# Patient Record
Sex: Female | Born: 1995 | Race: Black or African American | Hispanic: No | Marital: Single | Smoking: Never smoker
Health system: Southern US, Community
[De-identification: ages and names within clinical notes are randomized; demographics above are authoritative.]

## PROBLEM LIST (undated history)

## (undated) ENCOUNTER — Inpatient Hospital Stay (HOSPITAL_COMMUNITY): Payer: Self-pay

## (undated) DIAGNOSIS — G43909 Migraine, unspecified, not intractable, without status migrainosus: Secondary | ICD-10-CM

## (undated) DIAGNOSIS — T7840XA Allergy, unspecified, initial encounter: Secondary | ICD-10-CM

## (undated) HISTORY — DX: Allergy, unspecified, initial encounter: T78.40XA

## (undated) HISTORY — DX: Migraine, unspecified, not intractable, without status migrainosus: G43.909

---
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
## 2019-03-12 ENCOUNTER — Encounter: Payer: Self-pay | Admitting: Gastroenterology

## 2019-03-15 NOTE — Progress Notes (Signed)
referral paperwork entered.

## 2019-03-16 ENCOUNTER — Other Ambulatory Visit: Payer: Self-pay

## 2019-03-16 ENCOUNTER — Other Ambulatory Visit (INDEPENDENT_AMBULATORY_CARE_PROVIDER_SITE_OTHER): Payer: Self-pay

## 2019-03-16 ENCOUNTER — Encounter: Payer: Self-pay | Admitting: Gastroenterology

## 2019-03-16 ENCOUNTER — Ambulatory Visit: Payer: Self-pay | Admitting: Gastroenterology

## 2019-03-16 VITALS — BP 90/60 | HR 72 | Temp 98.7°F | Ht 64.0 in | Wt 107.0 lb

## 2019-03-16 DIAGNOSIS — K529 Noninfective gastroenteritis and colitis, unspecified: Secondary | ICD-10-CM

## 2019-03-16 DIAGNOSIS — R109 Unspecified abdominal pain: Secondary | ICD-10-CM

## 2019-03-16 DIAGNOSIS — R112 Nausea with vomiting, unspecified: Secondary | ICD-10-CM

## 2019-03-16 LAB — C-REACTIVE PROTEIN: CRP: <1 mg/dL (ref 0.5–20.0)

## 2019-03-16 LAB — COMPREHENSIVE METABOLIC PANEL
ALT: 21 U/L (ref 0–35)
AST: 19 U/L (ref 0–37)
Albumin: 4.8 g/dL (ref 3.5–5.2)
Alkaline Phosphatase: 43 U/L (ref 39–117)
BUN: 10 mg/dL (ref 6–23)
CO2: 25 mEq/L (ref 19–32)
Calcium: 9.7 mg/dL (ref 8.4–10.5)
Chloride: 105 mEq/L (ref 96–112)
Creatinine, Ser: 0.97 mg/dL (ref 0.40–1.20)
GFR: 85.5 mL/min (ref >60.00)
Glucose, Bld: 96 mg/dL (ref 70–99)
Potassium: 3.8 mEq/L (ref 3.5–5.1)
Sodium: 139 mEq/L (ref 135–145)
Total Bilirubin: 0.4 mg/dL (ref 0.2–1.2)
Total Protein: 7.7 g/dL (ref 6.0–8.3)

## 2019-03-16 LAB — CBC WITH DIFFERENTIAL/PLATELET
Basophils Absolute: 0.1 10*3/uL (ref 0.0–0.1)
Basophils Relative: 1 % (ref 0.0–3.0)
Eosinophils Absolute: 0.4 10*3/uL (ref 0.0–0.7)
Eosinophils Relative: 4.2 % (ref 0.0–5.0)
HCT: 40.7 % (ref 36.0–46.0)
Hemoglobin: 13.6 g/dL (ref 12.0–15.0)
Lymphocytes Relative: 37.8 % (ref 12.0–46.0)
Lymphs Abs: 3.3 10*3/uL (ref 0.7–4.0)
MCHC: 33.5 g/dL (ref 30.0–36.0)
MCV: 93.1 fl (ref 78.0–100.0)
Monocytes Absolute: 0.6 10*3/uL (ref 0.1–1.0)
Monocytes Relative: 6.9 % (ref 3.0–12.0)
Neutro Abs: 4.3 10*3/uL (ref 1.4–7.7)
Neutrophils Relative %: 50.1 % (ref 43.0–77.0)
Platelets: 289 10*3/uL (ref 150.0–400.0)
RBC: 4.37 Mil/uL (ref 3.87–5.11)
RDW: 13.4 % (ref 11.5–15.5)
WBC: 8.6 10*3/uL (ref 4.0–10.5)

## 2019-03-16 LAB — IGA: IgA: 109 mg/dL (ref 68–378)

## 2019-03-16 MED ORDER — ONDANSETRON 4 MG PO TBDP: 4.0000 mg | ORAL_TABLET | Freq: Four times a day (QID) | ORAL | 3 refills | Status: DC | PRN

## 2019-03-16 MED ORDER — SUPREP BOWEL PREP KIT 17.5-3.13-1.6 GM/177ML PO SOLN: ORAL | 0 refills | Status: DC

## 2019-03-16 MED ORDER — PANTOPRAZOLE SODIUM 40 MG PO TBEC: 40.0000 mg | DELAYED_RELEASE_TABLET | Freq: Every day | ORAL | 3 refills | Status: DC

## 2019-03-16 NOTE — Progress Notes (Signed)
HPI :  23 y/o female with a history of migraines, referred by Venida Jarvis MD for chronic diarrhea, multiple upper tract symptoms.   She reports in general symptoms bothering her since November.  She is frequently nauseated, and has a few episodes of vomiting per month which are unpredictable and occur without any clear precipitants.  Eating can sometimes precipitate her symptoms however fasting can also make her feel worse.  She generally has a very poor appetite and does not eat well.  She denies any heartburn or reflux symptoms, she denies any dysphagia.  She does have some discomfort in her epigastric area to mid abdomen.  She states this is intermittent and it comes and goes.  Again eating can often make this feel worse however at times can make her feel better.  She had had a trial of omeprazole which she states did not feel that it helps her too much, but then transition to ranitidine and found some help but it ran out.  During this time she has had persistent loose stools with urgency that have bothered her, she has roughly 1-2 bowel movements per day.  She does not see any obvious red blood however notes that her stools are intermittently dark black.  She does not find any relief of her abdomen discomfort with a bowel movement.  She thinks milk and dairy makes her symptoms worse she feels gassy and bloated frequently.  She previously took [facility] powder almost daily for migraine headaches back in November, she has stopped since then, but she does use ibuprofen periodically.  She is never had any prior surgeries.  She has no family history of inflammatory bowel disease, celiac disease, or GI tract malignancies that she is aware of.  She is never had an upper endoscopy or colonoscopy.  More recently her migraines have not been bothering her too much.  Generally she feels much worse in the morning than later in the day.  No fevers.  She does not use alcohol routinely.  She does endorse using marijuana  but roughly once or twice a month at most, only in social settings.  She denies any routine use of this.  She did not use marijuana to treat her symptoms.  She has tried some Pepto-Bismol which has not helped.  She has lost weight with the symptoms due to her poor appetite.  She is lost 7 pounds in the last month, currently weighs 107 pounds and is concerned about her weight.  In all her symptoms are a very dramatic change for her since November.  She never had any bowel symptoms or problems eating prior to this onset of symptoms and is quite concerned about it.  She states she has not had any blood work or imaging thus far since symptoms began.   Past Medical History:  Diagnosis Date  . Migraines      History reviewed. No pertinent surgical history. Family History  Problem Relation Age of Onset  . Depression Mother   . Diabetes Maternal Grandmother   . Hypertension Maternal Grandmother   . Heart disease Maternal Grandmother   . Cancer Maternal Aunt   . Cancer Paternal Aunt    Social History   Tobacco Use  . Smoking status: Never Smoker  . Smokeless tobacco: Never Used  Substance Use Topics  . Alcohol use: Not Currently  . Drug use: Never   Current Outpatient Medications  Medication Sig Dispense Refill  . fluconazole (DIFLUCAN) 150 MG tablet Take 150 mg  by mouth every 30 (thirty) days. Take one pill after each menstrual period for treatment of monthly yeast infection    . levonorgestrel-ethinyl estradiol (ALESSE) 0.1-20 MG-MCG tablet Take 1 tablet by mouth daily.    . ondansetron (ZOFRAN-ODT) 4 MG disintegrating tablet Take 1 tablet (4 mg total) by mouth every 6 (six) hours as needed for nausea or vomiting. 30 tablet 3  . pantoprazole (PROTONIX) 40 MG tablet Take 1 tablet (40 mg total) by mouth daily. 30 tablet 3  . SUPREP BOWEL PREP KIT 17.5-3.13-1.6 GM/177ML SOLN Suprep-Use as directed 354 mL 0   No current facility-administered medications for this visit.    No Known  Allergies   Review of Systems: All systems reviewed and negative except where noted in HPI.     Physical Exam: BP 90/60   Pulse 72   Temp 98.7 F (37.1 C) (Oral)   Ht '5\' 4"'$  (1.626 m)   Wt 107 lb (48.5 kg)   LMP 03/06/2019 (Approximate)   BMI 18.37 kg/m  Constitutional: Pleasant, thin appearing, female in no acute distress. HEENT: Normocephalic and atraumatic. Conjunctivae are normal. No scleral icterus. Neck supple.  Cardiovascular: Normal rate, regular rhythm.  Pulmonary/chest: Effort normal and breath sounds normal. No wheezing, rales or rhonchi. Abdominal: Soft, nondistended, mild epigastric TTP without rebound / guarding.. There are no masses palpable. No hepatomegaly. Extremities: no edema Lymphadenopathy: No cervical adenopathy noted. Neurological: Alert and oriented to person place and time. Skin: Skin is warm and dry. No rashes noted. Psychiatric: Normal mood and affect. Behavior is normal.   ASSESSMENT AND PLAN: 23 year old female here for new patient consultation for the following  Chronic diarrhea / Abdominal pain / Nausea and vomiting - patient previously healthy, now with chronic diarrhea, abdominal discomfort and frequent nausea and vomiting, all since November with persistent symptoms since that time.  She has been losing weight, not feeling well at all.  I discussed differential with her which is broad at this time, but includes H pylori, PUD / gastritis, celiac disease, IBD, etc. Functional disorder possible but these symptoms seem very out of the ordinary for this patient and she warrants further workup for organic disease. She does not use marijuana with any significant frequency, I think unrelated. I am going to send basic lab work to include CBC, CMET, TTG IgA, total IgA, CRP. Recommend trial of protonix '40mg'$  daily, and use of Zofran PRN for nausea.  Given the severity of her symptoms and recent weight loss I am otherwise recommending EGD and colonoscopy to  further evaluate.  I discussed the risks and benefits of endoscopy and anesthesia with her and she want to proceed.  Further recommendations pending this result.  She agreed with the plan. I advised her to avoid all NSAIDs in the interim.  [place] Cellar, MD [facility]  CC: Venida Jarvis, MD

## 2019-03-16 NOTE — Patient Instructions (Addendum)
If you are age 24 or older, your body mass index should be between 23-30. Your Body mass index is 18.37 kg/m. If this is out of the aforementioned range listed, please consider follow up with your Primary Care Provider.  If you are age 83 or younger, your body mass index should be between 19-25. Your Body mass index is 18.37 kg/m. If this is out of the aformentioned range listed, please consider follow up with your Primary Care Provider.   To help prevent the possible spread of infection to our patients, communities, and staff; we will be implementing the following measures:  As of now we are not allowing any visitors/family members to accompany you to any upcoming appointments with [facility]. If you have any concerns about this please contact our office to discuss prior to the appointment.   Please go to the lab in the basement of our building to have lab work done as you leave today. Hit "B" for basement when you get on the elevator.  When the doors open the lab is on your left.  We will call you with the results. Thank you.  You have been scheduled for an endoscopy and colonoscopy. Please follow the written instructions given to you at your visit today. Please pick up your prep supplies at the pharmacy within the next 1-3 days. If you use inhalers (even only as needed), please bring them with you on the day of your procedure. Your physician has requested that you go to www.startemmi.com and enter the access code given to you at your visit today. This web site gives a general overview about your procedure. However, you should still follow specific instructions given to you by our office regarding your preparation for the procedure.  We have sent the following medications to your pharmacy for you to pick up at your convenience: Protonix 40mg : Take once a day Zofran 4mg  ODT: Every 6 hours as needed   Thank you for entrusting me with your care and for choosing [facility], Dr. [place] Cellar

## 2019-03-17 LAB — TISSUE TRANSGLUTAMINASE, IGA: (tTG) Ab, IgA: 1 U/mL

## 2019-03-22 ENCOUNTER — Telehealth: Payer: Self-pay

## 2019-03-22 NOTE — Telephone Encounter (Signed)
Covid-19 screening questions   Do you now or have you had a fever in the last 14 days?  Do you have any respiratory symptoms of shortness of breath or cough now or in the last 14 days?  Do you have any family members or close contacts with diagnosed or suspected Covid-19 in the past 14 days?  Have you been tested for Covid-19 and found to be positive?      L/m to c/b.  

## 2019-03-22 NOTE — Telephone Encounter (Signed)
Patient answered "NO" to all covid-19 questions °

## 2019-03-23 ENCOUNTER — Other Ambulatory Visit: Payer: Self-pay

## 2019-03-23 ENCOUNTER — Ambulatory Visit (AMBULATORY_SURGERY_CENTER): Payer: Self-pay | Admitting: Gastroenterology

## 2019-03-23 ENCOUNTER — Encounter: Payer: Self-pay | Admitting: Gastroenterology

## 2019-03-23 VITALS — BP 99/53 | HR 46 | Temp 98.4°F | Resp 17 | Ht 64.0 in | Wt 107.0 lb

## 2019-03-23 DIAGNOSIS — K529 Noninfective gastroenteritis and colitis, unspecified: Secondary | ICD-10-CM

## 2019-03-23 DIAGNOSIS — K2970 Gastritis, unspecified, without bleeding: Secondary | ICD-10-CM

## 2019-03-23 DIAGNOSIS — R1084 Generalized abdominal pain: Secondary | ICD-10-CM

## 2019-03-23 DIAGNOSIS — K2950 Unspecified chronic gastritis without bleeding: Secondary | ICD-10-CM

## 2019-03-23 DIAGNOSIS — R109 Unspecified abdominal pain: Secondary | ICD-10-CM

## 2019-03-23 DIAGNOSIS — R112 Nausea with vomiting, unspecified: Secondary | ICD-10-CM

## 2019-03-23 MED ORDER — SODIUM CHLORIDE 0.9 % IV SOLN: 500.0000 mL | Freq: Once | INTRAVENOUS | Status: DC

## 2019-03-23 NOTE — Progress Notes (Signed)
Temp-michele boyd  Vital signs-courtney [place] 

## 2019-03-23 NOTE — Patient Instructions (Signed)
Read all handouts given to you by your recovery room nurse.  YOU HAD AN ENDOSCOPIC PROCEDURE TODAY AT THE [facility]:   Refer to the procedure report that was given to you for any specific questions about what was found during the examination.  If the procedure report does not answer your questions, please call your gastroenterologist to clarify.  If you requested that your care partner not be given the details of your procedure findings, then the procedure report has been included in a sealed envelope for you to review at your convenience later.  YOU SHOULD EXPECT: Some feelings of bloating in the abdomen. Passage of more gas than usual.  Walking can help get rid of the air that was put into your GI tract during the procedure and reduce the bloating. If you had a lower endoscopy (such as a colonoscopy or flexible sigmoidoscopy) you may notice spotting of blood in your stool or on the toilet paper. If you underwent a bowel prep for your procedure, you may not have a normal bowel movement for a few days.  Please Note:  You might notice some irritation and congestion in your nose or some drainage.  This is from the oxygen used during your procedure.  There is no need for concern and it should clear up in a day or so.  SYMPTOMS TO REPORT IMMEDIATELY:   Following lower endoscopy (colonoscopy or flexible sigmoidoscopy):  Excessive amounts of blood in the stool  Significant tenderness or worsening of abdominal pains  Swelling of the abdomen that is new, acute  Fever of 100F or higher   Following upper endoscopy (EGD)  Vomiting of blood or coffee ground material  New chest pain or pain under the shoulder blades  Painful or persistently difficult swallowing  New shortness of breath  Fever of 100F or higher  Black, tarry-looking stools  For urgent or emergent issues, a gastroenterologist can be reached at any hour by calling [phone].   DIET:  We do recommend a small meal at  first, but then you may proceed to your regular diet.  Drink plenty of fluids but you should avoid alcoholic beverages for 24 hours.  ACTIVITY:  You should plan to take it easy for the rest of today and you should NOT DRIVE or use heavy machinery until tomorrow (because of the sedation medicines used during the test).    FOLLOW UP: Our staff will call the number listed on your records 48-72 hours following your procedure to check on you and address any questions or concerns that you may have regarding the information given to you following your procedure. If we do not reach you, we will leave a message.  We will attempt to reach you two times.  During this call, we will ask if you have developed any symptoms of COVID 19. If you develop any symptoms (ie: fever, flu-like symptoms, shortness of breath, cough etc.) before then, please call [phone].  If you test positive for Covid 19 in the 2 weeks post procedure, please call and report this information to us.    If any biopsies were taken you will be contacted by phone or by letter within the next 1-3 weeks.  Please call us at [phone] if you have not heard about the biopsies in 3 weeks.    SIGNATURES/CONFIDENTIALITY: You and/or your care partner have signed paperwork which will be entered into your electronic medical record.  These signatures attest to the fact that that the   information above on your After Visit Summary has been reviewed and is understood.  Full responsibility of the confidentiality of this discharge information lies with you and/or your care-partner. 

## 2019-03-23 NOTE — Progress Notes (Signed)
To PACU, VSS. Report to [email or web address] 

## 2019-03-23 NOTE — Op Note (Signed)
[name] Procedure Date: 03/23/2019 2:59 PM MRN: [id number] Endoscopist: Viviann Spare P. Adela Lank , MD Age: 23 Referring MD:  Date of Birth: [birth year] Gender: Female Account #: [id number] Procedure:                Upper GI endoscopy Indications:              Abdominal pain, Diarrhea, Nausea with vomiting -                            improved with protonix and Zofran Medicines:                Monitored Anesthesia Care Procedure:                Pre-Anesthesia Assessment:                           - Prior to the procedure, a History and Physical                            was performed, and patient medications and                            allergies were reviewed. The patient's tolerance of                            previous anesthesia was also reviewed. The risks                            and benefits of the procedure and the sedation                            options and risks were discussed with the patient.                            All questions were answered, and informed consent                            was obtained. Prior Anticoagulants: The patient has                            taken no previous anticoagulant or antiplatelet                            agents. ASA Grade Assessment: I - A normal, healthy                            patient. After reviewing the risks and benefits,                            the patient was deemed in satisfactory condition to                            undergo the procedure.  After obtaining informed consent, the endoscope was                            passed under direct vision. Throughout the                            procedure, the patient's blood pressure, pulse, and                            oxygen saturations were monitored continuously. The                            Endoscope was introduced through the mouth, and                            advanced to the second part  of duodenum. The upper                            GI endoscopy was accomplished without difficulty.                            The patient tolerated the procedure well. Scope In: Scope Out: Findings:                 Esophagogastric landmarks were identified: the                            Z-line was found at 39 cm, the gastroesophageal                            junction was found at 39 cm and the upper extent of                            the gastric folds was found at 39 cm from the                            incisors.                           The exam of the esophagus was otherwise normal.                           The stomach was J shaped and angulated but                            otherwise normal. Biopsies were taken with a cold                            forceps for Helicobacter pylori testing.                           The duodenal bulb and second portion of the  duodenum were normal. Biopsies for histology were                            taken with a cold forceps for evaluation of celiac                            disease. Complications:            No immediate complications. Estimated blood loss:                            Minimal. Estimated Blood Loss:     Estimated blood loss was minimal. Impression:               - Esophagogastric landmarks identified.                           - Normal esophagus otherwise.                           - J shaped stomach, otherwise normal. Biopsied to                            rule out H pylori.                           - Normal duodenal bulb and second portion of the                            duodenum. Biopsied. Recommendation:           - Patient has a contact number available for                            emergencies. The signs and symptoms of potential                            delayed complications were discussed with the                            patient. Return to normal activities tomorrow.                             Written discharge instructions were provided to the                            patient.                           - Resume previous diet.                           - Continue present medications.                           - Await pathology results. Viviann SpareSteven P. Adela LankArmbruster, MD 03/23/2019 3:44:20 PM This report has been signed electronically.

## 2019-03-23 NOTE — Op Note (Signed)
[facility] Patient Name: Beverly ForthShakiya Fiala Procedure Date: 03/23/2019 2:58 PM MRN: [id number] Endoscopist: Viviann SpareSteven P. Adela LankArmbruster , MD Age: [age] Referring MD:  Date of Birth: 03-02-1996 Gender: Female Account #: [id number] Procedure:                Colonoscopy Indications:              Abdominal pain, Chronic diarrhea Medicines:                Monitored Anesthesia Care Procedure:                Pre-Anesthesia Assessment:                           - Prior to the procedure, a History and Physical                            was performed, and patient medications and                            allergies were reviewed. The patient's tolerance of                            previous anesthesia was also reviewed. The risks                            and benefits of the procedure and the sedation                            options and risks were discussed with the patient.                            All questions were answered, and informed consent                            was obtained. Prior Anticoagulants: The patient has                            taken no previous anticoagulant or antiplatelet                            agents. ASA Grade Assessment: I - A normal, healthy                            patient. After reviewing the risks and benefits,                            the patient was deemed in satisfactory condition to                            undergo the procedure.                           After obtaining informed consent, the colonoscope  was passed under direct vision. Throughout the                            procedure, the patient's blood pressure, pulse, and                            oxygen saturations were monitored continuously. The                            Colonoscope was introduced through the anus and                            advanced to the the terminal ileum, with                            identification of the appendiceal orifice  and IC                            valve. The colonoscopy was performed without                            difficulty. The patient tolerated the procedure                            well. The quality of the bowel preparation was                            good. The terminal ileum, ileocecal valve,                            appendiceal orifice, and rectum were photographed. Scope In: 3:20:13 PM Scope Out: 3:37:03 PM Scope Withdrawal Time: 0 hours 13 minutes 0 seconds  Total Procedure Duration: 0 hours 16 minutes 50 seconds  Findings:                 The perianal and digital rectal examinations were                            normal.                           The terminal ileum appeared normal.                           Anal papilla(e) were hypertrophied.                           The exam was otherwise without abnormality. No                            inflammatory changes noted.                           Biopsies for histology were taken with a cold  forceps from the right colon, left colon and                            transverse colon for evaluation of microscopic                            colitis. Complications:            No immediate complications. Estimated blood loss:                            Minimal. Estimated Blood Loss:     Estimated blood loss was minimal. Impression:               - The examined portion of the ileum was normal.                           - Anal papilla(e) were hypertrophied.                           - The examination was otherwise normal.                           - Biopsies were taken with a cold forceps from the                            right colon, left colon and transverse colon for                            evaluation of microscopic colitis. Recommendation:           - Patient has a contact number available for                            emergencies. The signs and symptoms of potential                            delayed  complications were discussed with the                            patient. Return to normal activities tomorrow.                            Written discharge instructions were provided to the                            patient.                           - Resume previous diet.                           - Continue present medications.                           - Await pathology results. Remo Lipps P. Havery Moros, MD 03/23/2019 3:40:41 PM This report has been signed  electronically.

## 2019-03-25 ENCOUNTER — Telehealth: Payer: Self-pay | Admitting: *Deleted

## 2019-03-25 NOTE — Telephone Encounter (Signed)
  Follow up Call-  Call back number 03/23/2019  Post procedure Call Back phone  # [phone]  Permission to leave phone message Yes  Some recent data might be hidden    Covid-19 screening questions   Do you now or have you had a fever in the last 14 days?no Do you have any respiratory symptoms of shortness of breath or cough now or in the last 14 days?no  Do you have any family members or close contacts with diagnosed or suspected Covid-19 in the past 14 days?no  Have you been tested for Covid-19 and found to be positive?no       Patient questions:  Do you have a fever, pain , or abdominal swelling? No. Pain Score  0 *  Have you tolerated food without any problems? Yes.    Have you been able to return to your normal activities? Yes.    Do you have any questions about your discharge instructions: Diet   No. Medications  No. Follow up visit  No.  Do you have questions or concerns about your Care? No.  Actions: * If pain score is 4 or above: No action needed, pain <4.

## 2019-03-26 ENCOUNTER — Other Ambulatory Visit: Payer: Self-pay

## 2019-03-26 MED ORDER — AMITRIPTYLINE HCL 10 MG PO TABS: ORAL_TABLET | ORAL | 0 refills | Status: DC

## 2019-05-24 DIAGNOSIS — K580 Irritable bowel syndrome with diarrhea: Secondary | ICD-10-CM | POA: Insufficient documentation

## 2020-03-21 ENCOUNTER — Other Ambulatory Visit: Payer: Medicaid Other

## 2020-05-09 ENCOUNTER — Other Ambulatory Visit: Payer: Medicaid Other

## 2020-07-27 ENCOUNTER — Other Ambulatory Visit: Payer: Medicaid Other

## 2020-08-03 ENCOUNTER — Other Ambulatory Visit: Payer: Medicaid Other

## 2020-08-10 ENCOUNTER — Other Ambulatory Visit: Payer: Medicaid Other

## 2021-02-19 ENCOUNTER — Other Ambulatory Visit: Payer: Medicaid Other

## 2021-10-29 ENCOUNTER — Emergency Department (HOSPITAL_COMMUNITY): Payer: No Typology Code available for payment source

## 2021-10-29 ENCOUNTER — Emergency Department (HOSPITAL_COMMUNITY)
Admission: EM | Admit: 2021-10-29 | Discharge: 2021-10-29 | Disposition: A | Discharge status: Home / Self Care | Payer: No Typology Code available for payment source | Attending: Emergency Medicine | Admitting: Emergency Medicine

## 2021-10-29 ENCOUNTER — Encounter (HOSPITAL_COMMUNITY): Payer: Self-pay

## 2021-10-29 DIAGNOSIS — N3091 Cystitis, unspecified with hematuria: Secondary | ICD-10-CM | POA: Diagnosis not present

## 2021-10-29 DIAGNOSIS — N898 Other specified noninflammatory disorders of vagina: Secondary | ICD-10-CM | POA: Diagnosis not present

## 2021-10-29 DIAGNOSIS — R319 Hematuria, unspecified: Secondary | ICD-10-CM

## 2021-10-29 DIAGNOSIS — R1031 Right lower quadrant pain: Secondary | ICD-10-CM

## 2021-10-29 DIAGNOSIS — Z87440 Personal history of urinary (tract) infections: Secondary | ICD-10-CM | POA: Insufficient documentation

## 2021-10-29 DIAGNOSIS — N3090 Cystitis, unspecified without hematuria: Secondary | ICD-10-CM

## 2021-10-29 LAB — CBC
HCT: 36.8 % (ref 36.0–46.0)
Hemoglobin: 12.3 g/dL (ref 12.0–15.0)
MCH: 31.7 pg (ref 26.0–34.0)
MCHC: 33.4 g/dL (ref 30.0–36.0)
MCV: 94.8 fL (ref 80.0–100.0)
Platelets: 299 10*3/uL (ref 150–400)
RBC: 3.88 MIL/uL (ref 3.87–5.11)
RDW: 12.9 % (ref 11.5–15.5)
WBC: 7.8 10*3/uL (ref 4.0–10.5)
nRBC: 0 % (ref 0.0–0.2)

## 2021-10-29 LAB — COMPREHENSIVE METABOLIC PANEL
ALT: 22 U/L (ref 0–44)
AST: 23 U/L (ref 15–41)
Albumin: 4 g/dL (ref 3.5–5.0)
Alkaline Phosphatase: 43 U/L (ref 38–126)
Anion gap: 6 (ref 5–15)
BUN: 16 mg/dL (ref 6–20)
CO2: 23 mmol/L (ref 22–32)
Calcium: 8.4 mg/dL — ABNORMAL LOW (ref 8.9–10.3)
Chloride: 109 mmol/L (ref 98–111)
Creatinine, Ser: 0.91 mg/dL (ref 0.44–1.00)
GFR, Estimated: >60 mL/min (ref >60)
Glucose, Bld: 146 mg/dL — ABNORMAL HIGH (ref 70–99)
Potassium: 3.4 mmol/L — ABNORMAL LOW (ref 3.5–5.1)
Sodium: 138 mmol/L (ref 135–145)
Total Bilirubin: 0.8 mg/dL (ref 0.3–1.2)
Total Protein: 6.7 g/dL (ref 6.5–8.1)

## 2021-10-29 LAB — I-STAT BETA HCG BLOOD, ED (MC, WL, AP ONLY): I-stat hCG, quantitative: <5 m[IU]/mL (ref <5)

## 2021-10-29 LAB — URINALYSIS, ROUTINE W REFLEX MICROSCOPIC
Bilirubin Urine: NEGATIVE
Glucose, UA: NEGATIVE mg/dL
Ketones, ur: NEGATIVE mg/dL
Nitrite: POSITIVE — AB
Protein, ur: NEGATIVE mg/dL
RBC / HPF: >50 RBC/hpf — ABNORMAL HIGH (ref 0–5)
Specific Gravity, Urine: 1.026 (ref 1.005–1.030)
pH: 6 (ref 5.0–8.0)

## 2021-10-29 LAB — LIPASE, BLOOD: Lipase: 25 U/L (ref 11–51)

## 2021-10-29 LAB — WET PREP, GENITAL
Clue Cells Wet Prep HPF POC: NONE SEEN
Sperm: NONE SEEN
Trich, Wet Prep: NONE SEEN
WBC, Wet Prep HPF POC: <10 (ref <10)
Yeast Wet Prep HPF POC: NONE SEEN

## 2021-10-29 MED ORDER — NITROFURANTOIN MONOHYD MACRO 100 MG PO CAPS: 100.0000 mg | ORAL_CAPSULE | Freq: Two times a day (BID) | ORAL | 0 refills | Status: DC

## 2021-10-29 MED ORDER — SODIUM CHLORIDE (PF) 0.9 % IJ SOLN
INTRAMUSCULAR | Status: AC
Start: 2021-10-29 — End: 2021-10-29
  Filled 2021-10-29: qty 50

## 2021-10-29 MED ORDER — CEFTRIAXONE SODIUM 1 G IJ SOLR
1.0000 g | Freq: Once | INTRAMUSCULAR | Status: AC
  Administered 2021-10-29: 1 g via INTRAVENOUS
  Filled 2021-10-29: qty 10

## 2021-10-29 MED ORDER — CEFTRIAXONE SODIUM 1 G IJ SOLR
500.0000 mg | Freq: Once | INTRAMUSCULAR | Status: DC
Start: 2021-10-29 — End: 2021-10-29

## 2021-10-29 MED ORDER — LIDOCAINE HCL (PF) 1 % IJ SOLN: 1.0000 mL | Freq: Once | INTRAMUSCULAR | Status: DC

## 2021-10-29 MED ORDER — IOHEXOL 300 MG/ML  SOLN
100.0000 mL | Freq: Once | INTRAMUSCULAR | Status: AC | PRN
  Administered 2021-10-29: 80 mL via INTRAVENOUS

## 2021-10-29 MED ORDER — FENTANYL CITRATE PF 50 MCG/ML IJ SOSY
50.0000 ug | PREFILLED_SYRINGE | Freq: Once | INTRAMUSCULAR | Status: AC
  Administered 2021-10-29: 50 ug via INTRAVENOUS
  Filled 2021-10-29: qty 1

## 2021-10-29 MED ORDER — DOXYCYCLINE HYCLATE 100 MG PO CAPS
100.0000 mg | ORAL_CAPSULE | Freq: Two times a day (BID) | ORAL | 0 refills | Status: DC
Start: 2021-10-29 — End: 2024-01-27

## 2021-10-29 MED ORDER — AZITHROMYCIN 250 MG PO TABS
1000.0000 mg | ORAL_TABLET | Freq: Once | ORAL | Status: AC
Start: 2021-10-29 — End: 2021-10-29
  Administered 2021-10-29: 1000 mg via ORAL
  Filled 2021-10-29: qty 4

## 2021-10-29 MED ORDER — AZITHROMYCIN 250 MG PO TABS: 1000.0000 mg | ORAL_TABLET | Freq: Once | ORAL | Status: DC

## 2021-10-29 MED ORDER — PHENAZOPYRIDINE HCL 200 MG PO TABS: 200.0000 mg | ORAL_TABLET | Freq: Three times a day (TID) | ORAL | 0 refills | Status: DC

## 2021-10-29 MED ORDER — ONDANSETRON HCL 4 MG/2ML IJ SOLN
4.0000 mg | Freq: Once | INTRAMUSCULAR | Status: AC
  Administered 2021-10-29: 4 mg via INTRAVENOUS
  Filled 2021-10-29: qty 2

## 2021-10-29 NOTE — Consult Note (Signed)
[facility] ?Consult Note ? ?MWUXLKG Beverly Paul ?[birth year]  ?[id number].   ? ?Requesting MD: Derwood Kaplan ?Chief Complaint/Reason for Consult: RLQ pain ? ?HPI:  ?Beverly Paul is a 26yo female who presented to [facility] for evaluation of RLQ abdominal pain. States that the pain started at 0200 this morning. It radiates into her back. Associated with nausea and 2 episodes of emesis. Denies fever or chills. States that she first had urinary urgency, which she has had with UTIs in the past, but she has never had any pain like this before. Denies any dysuria. Last BM was yesterday morning and normal. States that she typically has 2-3 bowel movements per day. Patient does also state that she was recently on treatment for BV. ? ?Abdominal surgical history: none ? ?Review of Systems  ?Constitutional: Negative.   ?Respiratory: Negative.    ?Cardiovascular: Negative.   ?Gastrointestinal:  Positive for abdominal pain, constipation, nausea and vomiting.  ?Genitourinary:  Positive for urgency. Negative for dysuria.  ? ?All systems reviewed and otherwise negative except for as above ? ?Family History  ?Problem Relation Age of Onset  ? Depression Mother   ? Diabetes Maternal Grandmother   ? Hypertension Maternal Grandmother   ? Heart disease Maternal Grandmother   ? Cancer Maternal Aunt   ? Cancer Paternal Aunt   ? Colon cancer Neg Hx   ? Colon polyps Neg Hx   ? Esophageal cancer Neg Hx   ? Stomach cancer Neg Hx   ? Rectal cancer Neg Hx   ? ? ?Past Medical History:  ?Diagnosis Date  ? Allergy   ? seasonal  ? Migraines   ? ? ?History reviewed. No pertinent surgical history. ? ?Social History:  reports that she has never smoked. She has never used smokeless tobacco. She reports that she does not currently use alcohol. She reports that she does not use drugs. ? ?Allergies: No Known Allergies ? ?(Not in a hospital admission) ? ? ?Prior to Admission medications   ?Medication Sig Start Date End Date Taking? Authorizing Provider   ?amitriptyline (ELAVIL) 10 MG tablet Take 10 mg every night at bedtime for 14 days then increase to 20 mg every night at bedtime 03/26/19   Armbruster, Willaim Rayas, MD  ?fluconazole (DIFLUCAN) 150 MG tablet Take 150 mg by mouth every 30 (thirty) days. Take one pill after each menstrual period for treatment of monthly yeast infection    [provider]  ?levonorgestrel-ethinyl estradiol (ALESSE) 0.1-20 MG-MCG tablet Take 1 tablet by mouth daily.    [provider]  ?ondansetron (ZOFRAN-ODT) 4 MG disintegrating tablet Take 1 tablet (4 mg total) by mouth every 6 (six) hours as needed for nausea or vomiting. 03/16/19   Armbruster, Willaim Rayas, MD  ?pantoprazole (PROTONIX) 40 MG tablet Take 1 tablet (40 mg total) by mouth daily. 03/16/19   Armbruster, Willaim Rayas, MD  ? ? ?Blood pressure 104/61, pulse 60, temperature (!) 97.5 ?F (36.4 ?C), temperature source Oral, resp. rate 18, height 5\' 5"  (1.651 m), weight 49.9 kg, last menstrual period 10/12/2021, SpO2 99 %. ?Physical Exam: ?General: pleasant, WD/WN female who is laying in bed in NAD ?HEENT: head is normocephalic, atraumatic.  Sclera are noninjected.  Pupils equal and round.  Ears and nose without any masses or lesions.  Mouth is pink and moist. Dentition fair ?Heart: regular, rate, and rhythm.  Normal s1,s2. No obvious murmurs, gallops, or rubs noted. ?Lungs: CTAB, no wheezes, rhonchi, or rales noted.  Respiratory effort nonlabored ?Abd: soft, NT/ND, +  BS, no masses, hernias, or organomegaly ?MS: no BUE/BLE edema, calves soft and nontender ?Skin: warm and dry with no masses, lesions, or rashes ?Psych: A&Ox4 with an appropriate affect ?Neuro: cranial nerves grossly intact, equal strength in BUE/BLE bilaterally, normal speech, thought process intact ? ?Results for orders placed or performed during the hospital encounter of 10/29/21 (from the past 48 hour(s))  ?Lipase, blood     Status: None  ? Collection Time: 10/29/21  9:18 AM  ?Result Value Ref Range  ? Lipase  25 11 - 51 U/L  ?  Comment: Performed at [facility], [address] [place], [place] [address]  ?Comprehensive metabolic panel     Status: Abnormal  ? Collection Time: 10/29/21  9:18 AM  ?Result Value Ref Range  ? Sodium 138 135 - 145 mmol/L  ? Potassium 3.4 (L) 3.5 - 5.1 mmol/L  ? Chloride 109 98 - 111 mmol/L  ? CO2 23 22 - 32 mmol/L  ? Glucose, Bld 146 (H) 70 - 99 mg/dL  ?  Comment: Glucose reference range applies only to samples taken after fasting for at least 8 hours.  ? BUN 16 6 - 20 mg/dL  ? Creatinine, Ser 0.91 0.44 - 1.00 mg/dL  ? Calcium 8.4 (L) 8.9 - 10.3 mg/dL  ? Total Protein 6.7 6.5 - 8.1 g/dL  ? Albumin 4.0 3.5 - 5.0 g/dL  ? AST 23 15 - 41 U/L  ? ALT 22 0 - 44 U/L  ? Alkaline Phosphatase 43 38 - 126 U/L  ? Total Bilirubin 0.8 0.3 - 1.2 mg/dL  ? GFR, Estimated >60 >60 mL/min  ?  Comment: (NOTE) ?Calculated using the CKD-EPI Creatinine Equation (2021) ?  ? Anion gap 6 5 - 15  ?  Comment: Performed at [facility], [address] [place], [place] [address]  ?CBC     Status: None  ? Collection Time: 10/29/21  9:18 AM  ?Result Value Ref Range  ? WBC 7.8 4.0 - 10.5 K/uL  ? RBC 3.88 3.87 - 5.11 MIL/uL  ? Hemoglobin 12.3 12.0 - 15.0 g/dL  ? HCT 36.8 36.0 - 46.0 %  ? MCV 94.8 80.0 - 100.0 fL  ? MCH 31.7 26.0 - 34.0 pg  ? MCHC 33.4 30.0 - 36.0 g/dL  ? RDW 12.9 11.5 - 15.5 %  ? Platelets 299 150 - 400 K/uL  ? nRBC 0.0 0.0 - 0.2 %  ?  Comment: Performed at [facility], [address] [place], [place] [address]  ?I-Stat beta hCG blood, ED     Status: None  ? Collection Time: 10/29/21  9:19 AM  ?Result Value Ref Range  ? I-stat hCG, quantitative <5.0 <5 mIU/mL  ? Comment 3          ?  Comment:   GEST. AGE      CONC.  (mIU/mL) ?  <=1 WEEK        5 - 50 ?    2 WEEKS       50 - 500 ?    3 WEEKS       100 - 10,000 ?    4 WEEKS     1,000 - 30,000 ?       ?FEMALE AND NON-PREGNANT FEMALE: ?    LESS THAN 5 mIU/mL ?  ?Urinalysis, Routine w reflex microscopic  Urine, Clean Catch     Status: Abnormal  ? Collection Time: 10/29/21 12:48 PM  ?Result Value  Ref Range  ? Color, Urine YELLOW YELLOW  ? APPearance HAZY (A) CLEAR  ? Specific Gravity, Urine 1.026 1.005 - 1.030  ? pH 6.0 5.0 - 8.0  ? Glucose, UA NEGATIVE NEGATIVE mg/dL  ? Hgb urine dipstick LARGE (A) NEGATIVE  ? Bilirubin Urine NEGATIVE NEGATIVE  ? Ketones, ur NEGATIVE NEGATIVE mg/dL  ? Protein, ur NEGATIVE NEGATIVE mg/dL  ? Nitrite POSITIVE (A) NEGATIVE  ? Leukocytes,Ua TRACE (A) NEGATIVE  ? RBC / HPF >50 (H) 0 - 5 RBC/hpf  ? WBC, UA 0-5 0 - 5 WBC/hpf  ? Bacteria, UA MANY (A) NONE SEEN  ? Squamous Epithelial / LPF 0-5 0 - 5  ? Mucus PRESENT   ?  Comment: Performed at [facility], [address] [place], [place] [address]  ?Wet prep, genital     Status: None  ? Collection Time: 10/29/21  2:02 PM  ? Specimen: Cervical / Endocervical swab; Genital  ?Result Value Ref Range  ? Yeast Wet Prep HPF POC NONE SEEN NONE SEEN  ? Trich, Wet Prep NONE SEEN NONE SEEN  ? Clue Cells Wet Prep HPF POC NONE SEEN NONE SEEN  ? WBC, Wet Prep HPF POC <10 <10  ? Sperm NONE SEEN   ?  Comment: Performed at [facility], [address], [place], [place] [address]  ? ?US Transvaginal Non-OB ? ?Result Date: 10/29/2021 ?CLINICAL DATA:  Pelvic pain.  Last menstrual period 10/12/2021 EXAM: TRANSABDOMINAL AND TRANSVAGINAL ULTRASOUND OF PELVIS DOPPLER ULTRASOUND OF OVARIES TECHNIQUE: Both transabdominal and transvaginal ultrasound examinations of the pelvis were performed. Transabdominal technique was performed for global imaging of the pelvis including uterus, ovaries, adnexal regions, and pelvic cul-de-sac. It was necessary to proceed with endovaginal exam following the transabdominal exam to visualize the endometrium and ovaries. Color and duplex Doppler ultrasound was utilized to evaluate blood flow to the ovaries. COMPARISON:  None. FINDINGS: Uterus Measurements: 6.8 x 3.1 x 3.7 cm = volume: 40 mL. No  fibroids or other mass visualized. Endometrium Thickness: 4.4 mm.  No focal abnormality visualized. Right ovary Measurements: 3.1 x 2.2 x 2.9 cm = volume: 10.3 mL. Normal appearance/no adnexal mass. Left ovary Mea

## 2021-10-29 NOTE — Discharge Instructions (Addendum)
You are seen in the ER for right lower quadrant abdominal pain, back pain. ? ?As discussed, CT scan was equivocal for appendicitis.  This means, if you start having worsening right lower quadrant pain, severe nausea and vomiting, fevers, please return to the ER for reassessment ? ?Antibiotics for UTI and cervicitis provided. ?Follow-up with your primary care doctor to ensure that your symptoms have resolved and blood in the urine has resolved. ? ? ?

## 2021-10-29 NOTE — ED Triage Notes (Signed)
Pt arrived via EMS, from home, RLQ abd pain, radiating to back since 2am this morning.  ? ? ?20 G L AC  ?

## 2021-10-29 NOTE — ED Provider Notes (Signed)
?[facility]-EMERGENCY DEPT ?Provider Note ? ? ?CSN: [id number] ?Arrival date & time: 10/29/21  0851 ? ?  ? ?History ? ?Chief Complaint  ?Patient presents with  ? Abdominal Pain  ? ? ?Beverly Paul is a 26 y.o. female. ? ?HPI ? ?  ? ?26 year old female comes in with chief complaint of right lower quadrant abdominal pain. ? ?Patient indicates that her pain started in the middle the night.  Pain is located in the right lower quadrant and it radiates to the back.  The pain has worsened over time, prompting her to come to the ER. ? ?She had mild urinary urgency overnight, but denies any pain with urination.  She has had UTI in the past, and her current symptoms are not similar.  There is no history of kidney stones. ? ?Patient denies any burning with urination, blood in the urine.  She is having vaginal discharge, but is getting treatment for BV at this time.  She is having unprotected intercourse with 1 partner, she has been seeing them for about 4 to 6 months now. ? ? ?Home Medications ?Prior to Admission medications   ?Medication Sig Start Date End Date Taking? Authorizing Provider  ?doxycycline (VIBRAMYCIN) 100 MG capsule Take 1 capsule (100 mg total) by mouth 2 (two) times daily. 10/29/21  Yes Derwood Kaplan, MD  ?nitrofurantoin, macrocrystal-monohydrate, (MACROBID) 100 MG capsule Take 1 capsule (100 mg total) by mouth 2 (two) times daily. 10/29/21  Yes Derwood Kaplan, MD  ?phenazopyridine (PYRIDIUM) 200 MG tablet Take 1 tablet (200 mg total) by mouth 3 (three) times daily. 10/29/21  Yes Derwood Kaplan, MD  ?amitriptyline (ELAVIL) 10 MG tablet Take 10 mg every night at bedtime for 14 days then increase to 20 mg every night at bedtime 03/26/19   Armbruster, Willaim Rayas, MD  ?fluconazole (DIFLUCAN) 150 MG tablet Take 150 mg by mouth every 30 (thirty) days. Take one pill after each menstrual period for treatment of monthly yeast infection    [provider]  ?levonorgestrel-ethinyl estradiol  (ALESSE) 0.1-20 MG-MCG tablet Take 1 tablet by mouth daily.    [provider]  ?ondansetron (ZOFRAN-ODT) 4 MG disintegrating tablet Take 1 tablet (4 mg total) by mouth every 6 (six) hours as needed for nausea or vomiting. 03/16/19   Armbruster, Willaim Rayas, MD  ?pantoprazole (PROTONIX) 40 MG tablet Take 1 tablet (40 mg total) by mouth daily. 03/16/19   Armbruster, Willaim Rayas, MD  ?   ? ?Allergies    ?Patient has no known allergies.   ? ?Review of Systems   ?Review of Systems  ?All other systems reviewed and are negative. ? ?Physical Exam ?Updated Vital Signs ?BP 100/61   Pulse 67   Temp (!) 97.5 ?F (36.4 ?C) (Oral)   Resp 18   Ht 5\' 5"  (1.651 m)   Wt 49.9 kg   LMP 10/12/2021   SpO2 100%   BMI 18.30 kg/m?  ?Physical Exam ?Vitals and nursing note reviewed.  ?Constitutional:   ?   Appearance: She is well-developed.  ?HENT:  ?   Head: Atraumatic.  ?Cardiovascular:  ?   Rate and Rhythm: Normal rate.  ?Pulmonary:  ?   Effort: Pulmonary effort is normal.  ?Abdominal:  ?   Tenderness: There is abdominal tenderness. There is right CVA tenderness and guarding. There is no rebound. Negative signs include Murphy's sign.  ?   Comments: Right lower quadrant tenderness with guarding, right flank tenderness and suprapubic tenderness  ?Genitourinary: ?  Comments: Patient has no cervical motion tenderness, copious amount of vaginal discharge was noted ?Musculoskeletal:  ?   Cervical back: Normal range of motion and neck supple.  ?Skin: ?   General: Skin is warm and dry.  ?Neurological:  ?   Mental Status: She is alert and oriented to person, place, and time.  ? ? ?ED Results / Procedures / Treatments   ?Labs ?(all labs ordered are listed, but only abnormal results are displayed) ?Labs Reviewed  ?COMPREHENSIVE METABOLIC PANEL - Abnormal; Notable for the following components:  ?    Result Value  ? Potassium 3.4 (*)   ? Glucose, Bld 146 (*)   ? Calcium 8.4 (*)   ? All other components within normal limits  ?URINALYSIS,  ROUTINE W REFLEX MICROSCOPIC - Abnormal; Notable for the following components:  ? APPearance HAZY (*)   ? Hgb urine dipstick LARGE (*)   ? Nitrite POSITIVE (*)   ? Leukocytes,Ua TRACE (*)   ? RBC / HPF >50 (*)   ? Bacteria, UA MANY (*)   ? All other components within normal limits  ?WET PREP, GENITAL  ?LIPASE, BLOOD  ?CBC  ?I-STAT BETA HCG BLOOD, ED ([facility], [facility], [facility] ONLY)  ?GC/CHLAMYDIA PROBE AMP ([place]) NOT AT [facility]  ? ? ?EKG ?None ? ?Radiology ?US Transvaginal Non-OB ? ?Result Date: 10/29/2021 ?CLINICAL DATA:  Pelvic pain.  Last menstrual period 10/12/2021 EXAM: TRANSABDOMINAL AND TRANSVAGINAL ULTRASOUND OF PELVIS DOPPLER ULTRASOUND OF OVARIES TECHNIQUE: Both transabdominal and transvaginal ultrasound examinations of the pelvis were performed. Transabdominal technique was performed for global imaging of the pelvis including uterus, ovaries, adnexal regions, and pelvic cul-de-sac. It was necessary to proceed with endovaginal exam following the transabdominal exam to visualize the endometrium and ovaries. Color and duplex Doppler ultrasound was utilized to evaluate blood flow to the ovaries. COMPARISON:  None. FINDINGS: Uterus Measurements: 6.8 x 3.1 x 3.7 cm = volume: 40 mL. No fibroids or other mass visualized. Endometrium Thickness: 4.4 mm.  No focal abnormality visualized. Right ovary Measurements: 3.1 x 2.2 x 2.9 cm = volume: 10.3 mL. Normal appearance/no adnexal mass. Left ovary Measurements: 3.4 x 3.1 x 2.4 cm = volume: 11.3 mL. Normal appearance/no adnexal mass. Pulsed Doppler evaluation of both ovaries demonstrates normal low-resistance arterial and venous waveforms. Other findings No abnormal free fluid. IMPRESSION: 1. Normal uterus and ovaries. 2. Normal color Doppler flow to the LEFT and RIGHT ovary. Electronically Signed   By: Genevive BiStewart  Edmunds M.D.   On: 10/29/2021 11:05  ? ?US Pelvis Complete ? ?Result Date: 10/29/2021 ?CLINICAL DATA:  Pelvic pain.  Last menstrual period 10/12/2021 EXAM: TRANSABDOMINAL AND  TRANSVAGINAL ULTRASOUND OF PELVIS DOPPLER ULTRASOUND OF OVARIES TECHNIQUE: Both transabdominal and transvaginal ultrasound examinations of the pelvis were performed. Transabdominal technique was performed for global imaging of the pelvis including uterus, ovaries, adnexal regions, and pelvic cul-de-sac. It was necessary to proceed with endovaginal exam following the transabdominal exam to visualize the endometrium and ovaries. Color and duplex Doppler ultrasound was utilized to evaluate blood flow to the ovaries. COMPARISON:  None. FINDINGS: Uterus Measurements: 6.8 x 3.1 x 3.7 cm = volume: 40 mL. No fibroids or other mass visualized. Endometrium Thickness: 4.4 mm.  No focal abnormality visualized. Right ovary Measurements: 3.1 x 2.2 x 2.9 cm = volume: 10.3 mL. Normal appearance/no adnexal mass. Left ovary Measurements: 3.4 x 3.1 x 2.4 cm = volume: 11.3 mL. Normal appearance/no adnexal mass. Pulsed Doppler evaluation of both ovaries demonstrates normal low-resistance arterial and venous  waveforms. Other findings No abnormal free fluid. IMPRESSION: 1. Normal uterus and ovaries. 2. Normal color Doppler flow to the LEFT and RIGHT ovary. Electronically Signed   By: Genevive Bi M.D.   On: 10/29/2021 11:05  ? ?CT ABDOMEN PELVIS W CONTRAST ? ?Result Date: 10/29/2021 ?CLINICAL DATA:  Right lower quadrant abdominal pain since 2 a.m. EXAM: CT ABDOMEN AND PELVIS WITH CONTRAST TECHNIQUE: Multidetector CT imaging of the abdomen and pelvis was performed using the standard protocol following bolus administration of intravenous contrast. RADIATION DOSE REDUCTION: This exam was performed according to the departmental dose-optimization program which includes automated exposure control, adjustment of the mA and/or kV according to patient size and/or use of iterative reconstruction technique. CONTRAST:  53mL OMNIPAQUE IOHEXOL 300 MG/ML  SOLN COMPARISON:  Same day pelvic ultrasound FINDINGS: Lower chest: No acute abnormality.  Hepatobiliary: No suspicious hepatic lesion. Gallbladder is unremarkable. No biliary ductal dilation. Pancreas: No pancreatic ductal dilation or evidence of acute inflammation. Spleen: No splenomegaly or focal splenic lesion.

## 2021-10-30 LAB — GC/CHLAMYDIA PROBE AMP (~~LOC~~) NOT AT ARMC
Chlamydia: NEGATIVE
Comment: NEGATIVE
Comment: NORMAL
Neisseria Gonorrhea: NEGATIVE

## 2021-11-21 ENCOUNTER — Other Ambulatory Visit: Payer: Self-pay | Admitting: Obstetrics & Gynecology

## 2021-11-21 DIAGNOSIS — Z3169 Encounter for other general counseling and advice on procreation: Secondary | ICD-10-CM

## 2022-02-27 ENCOUNTER — Other Ambulatory Visit: Payer: Self-pay | Admitting: Family Medicine

## 2023-07-02 IMAGING — CT CT ABD-PELV W/ CM
2 of 4 series · 16 of 46 positions shown, 18 images · IV contrast (OMNIPAQUE 300)
Comparison: Same day pelvic ultrasound

CLINICAL DATA: Right lower quadrant abdominal pain since 2 a.m.

EXAM:
CT ABDOMEN AND PELVIS WITH CONTRAST
TECHNIQUE: Multidetector CT imaging of the abdomen and pelvis was performed
using the standard protocol following bolus administration of
intravenous contrast.

[Series 2: axial st · axial · 0.68mm/px · z∈[-494,-124]mm · 13 of 84 slices shown, 15 images]
[im 5/84  soft-tissue]
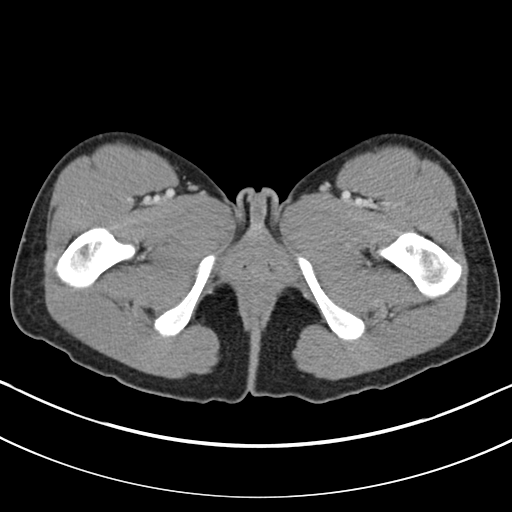
[im 5/84  bone]
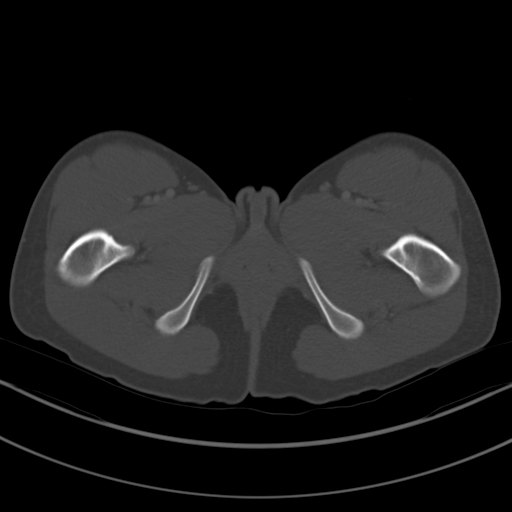
[im 13/84  soft-tissue]
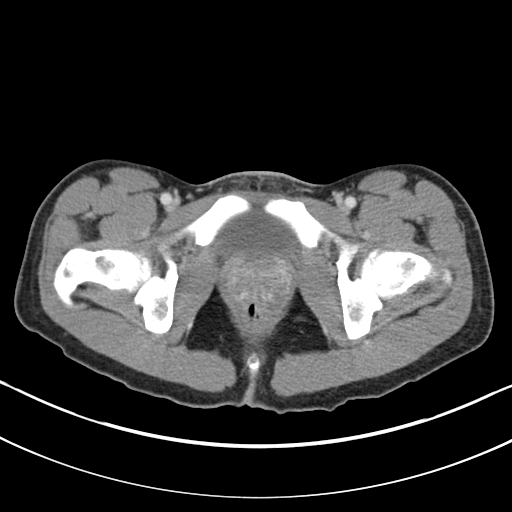
[im 17/84  soft-tissue]
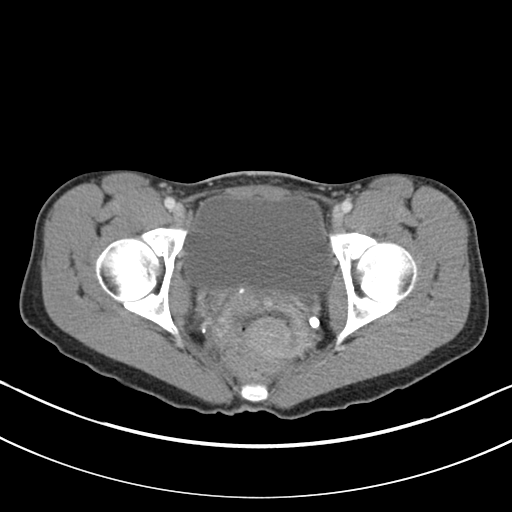
[im 25/84  soft-tissue]
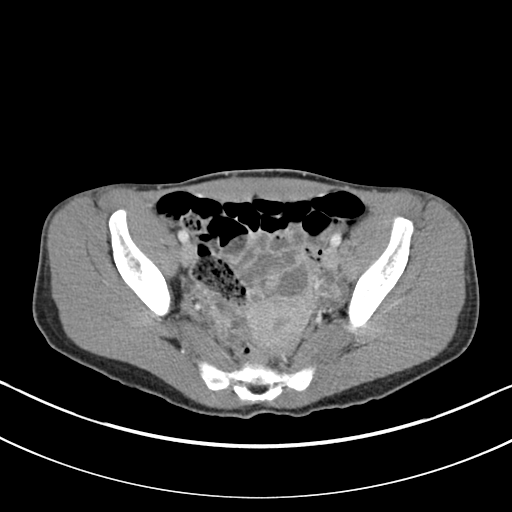
[im 30/84  soft-tissue]
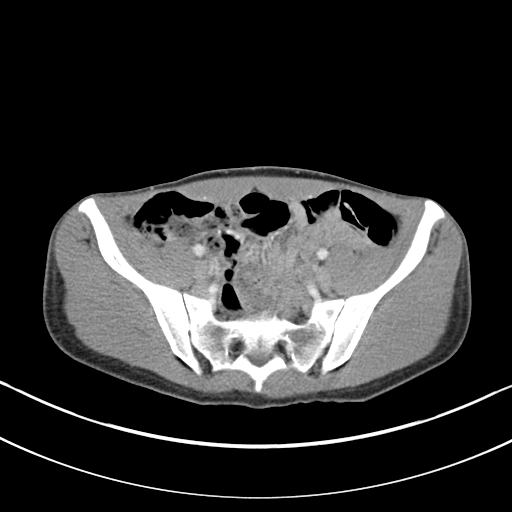
[im 38/84  soft-tissue]
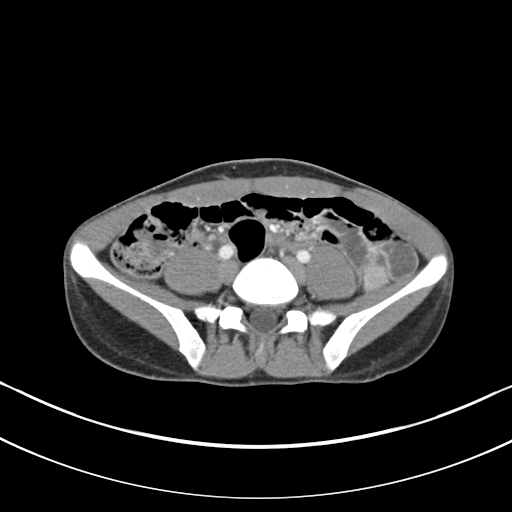
[im 42/84  soft-tissue]
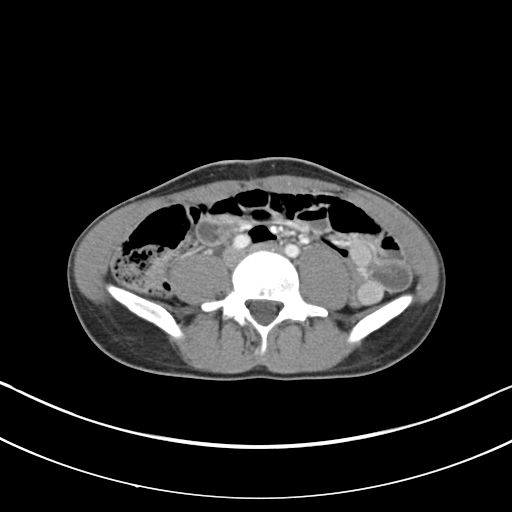
[im 46/84  soft-tissue]
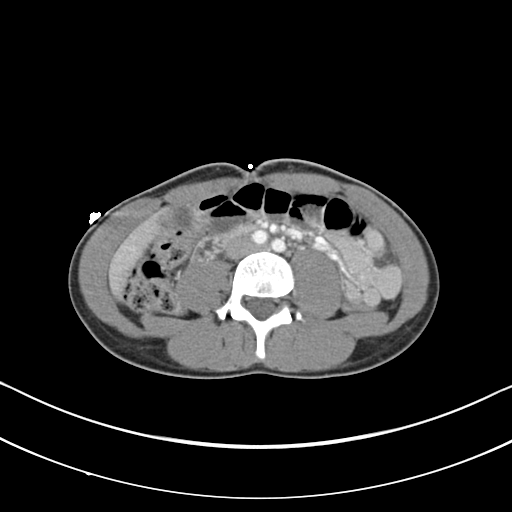
[im 54/84  soft-tissue]
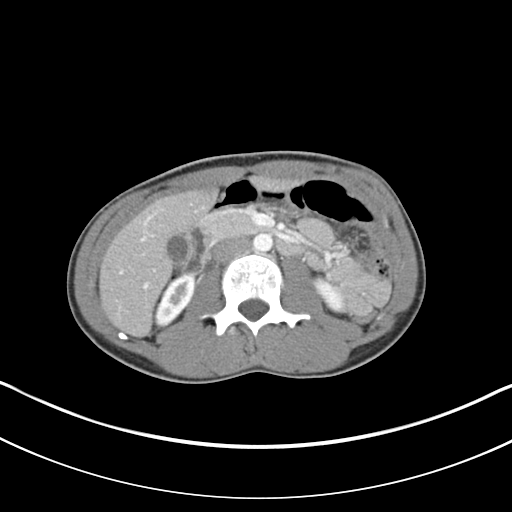
[im 54/84  bone]
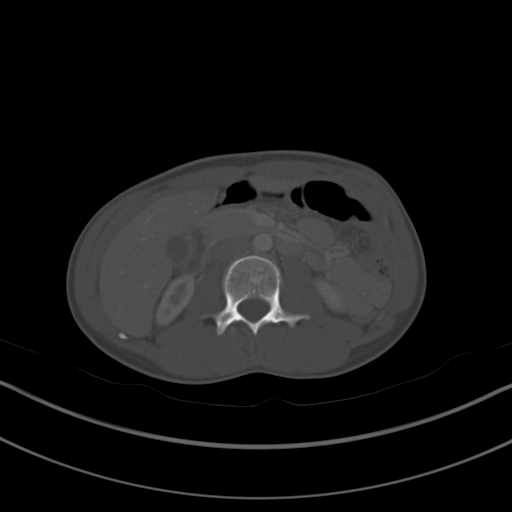
[im 59/84  soft-tissue]
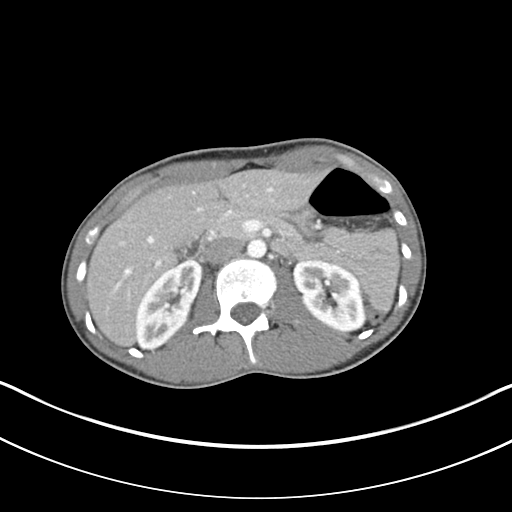
[im 67/84  soft-tissue]
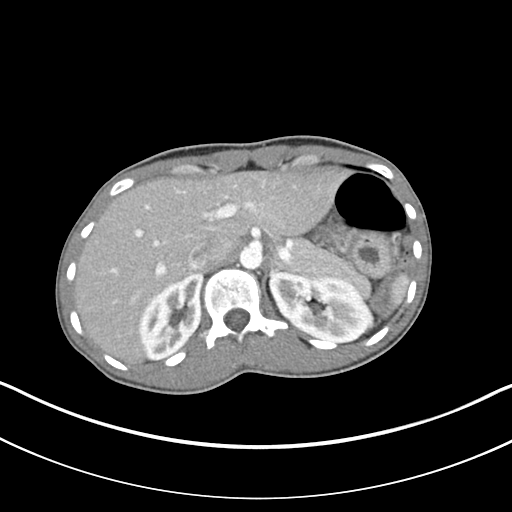
[im 71/84  soft-tissue]
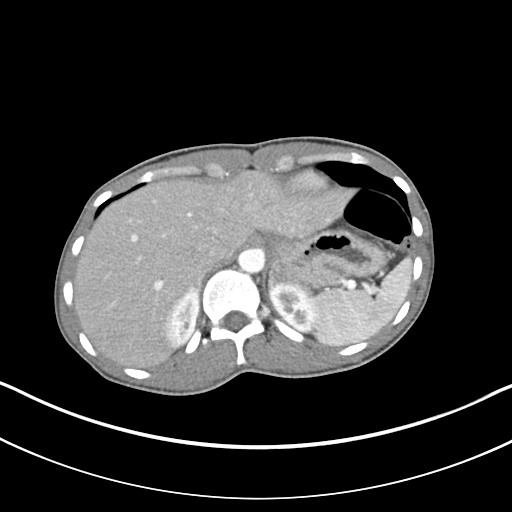
[im 79/84  soft-tissue]
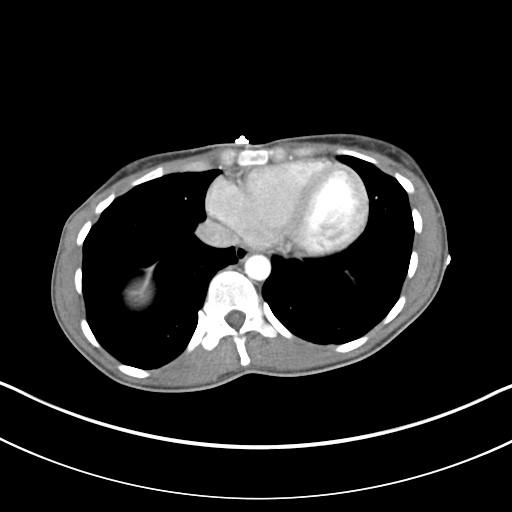

[Series 5: coronal st · coronal · 0.68mm/px · 3 of 99 slices shown]
[im 33/99  soft-tissue]
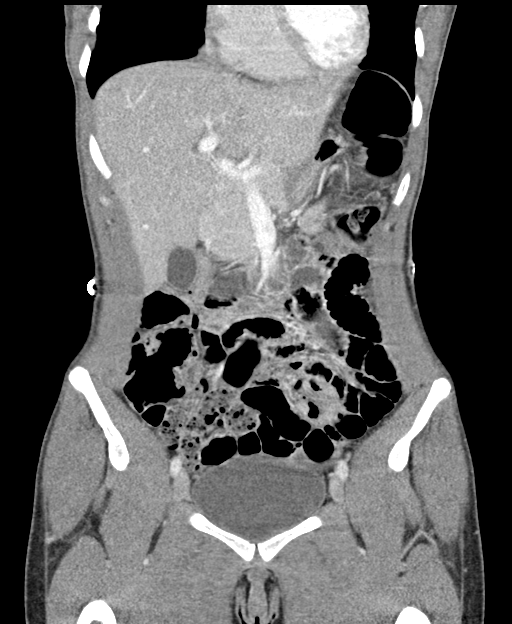
[im 44/99  soft-tissue]
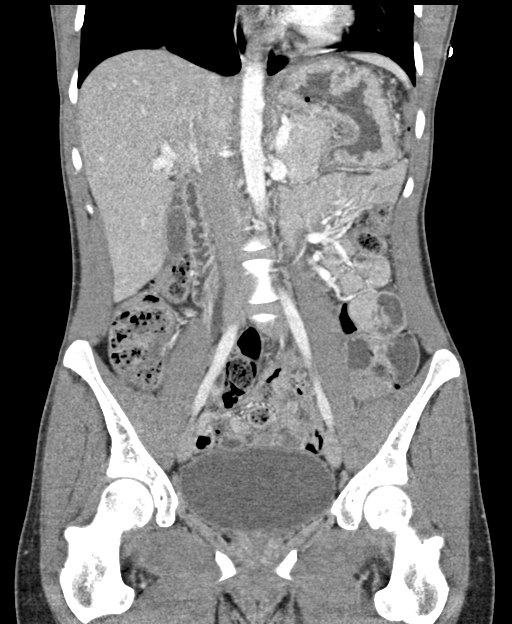
[im 55/99  soft-tissue]
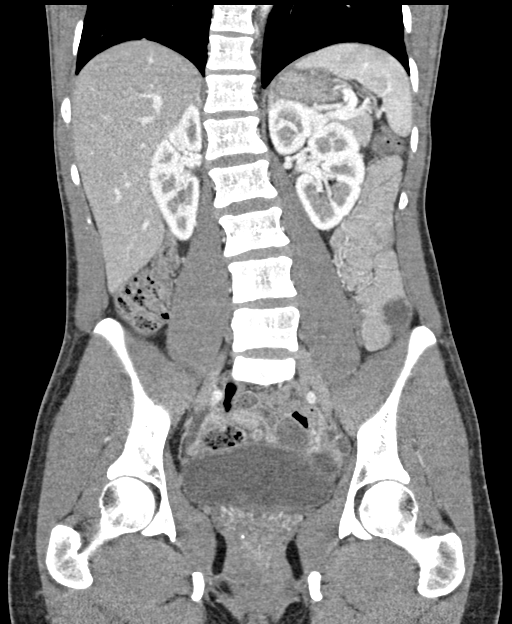

[16 of 46 positions shown; findings below may reference images not displayed]

RADIATION DOSE REDUCTION: This exam was performed according to the
departmental dose-optimization program which includes automated
exposure control, adjustment of the mA and/or kV according to
patient size and/or use of iterative reconstruction technique.

CONTRAST:  80mL OMNIPAQUE IOHEXOL 300 MG/ML  SOLN
FINDINGS: Lower chest: No acute abnormality.

Hepatobiliary: No suspicious hepatic lesion. Gallbladder is
unremarkable. No biliary ductal dilation.

Pancreas: No pancreatic ductal dilation or evidence of acute
inflammation.

Spleen: No splenomegaly or focal splenic lesion.

Adrenals/Urinary Tract: Bilateral adrenal glands appear normal. No
hydronephrosis. Kidneys demonstrate symmetric enhancement. Urinary
bladder is unremarkable.

Stomach/Bowel: No radiopaque enteric contrast was administered.
Stomach is unremarkable for degree of distension. No pathologic
dilation of small or large bowel. The appendix is not confidently
identified however there is no pericecal inflammation. No evidence
of acute bowel inflammation.

Vascular/Lymphatic: Normal caliber abdominal aorta. No
pathologically enlarged abdominal or pelvic lymph nodes.

Reproductive: Uterus and bilateral adnexa are unremarkable in CT
appearance for reproductive age female.

Other: Trace pelvic free fluid is within physiologic normal limits.

Musculoskeletal: Mild S-shaped curvature of the thoracolumbar spine.
No acute osseous abnormality.
IMPRESSION: 1. No acute abnormality in the abdomen or pelvis.
2. Appendix not confidently identified. However, there is no
pericecal inflammation or other findings to suggest appendicitis.

## 2024-01-26 ENCOUNTER — Other Ambulatory Visit: Payer: Self-pay

## 2024-01-26 ENCOUNTER — Inpatient Hospital Stay (HOSPITAL_COMMUNITY)
Admission: AD | Admit: 2024-01-26 | Discharge: 2024-01-27 | Disposition: A | Discharge status: Home / Self Care | Attending: Obstetrics and Gynecology | Admitting: Obstetrics and Gynecology

## 2024-01-26 ENCOUNTER — Encounter (HOSPITAL_COMMUNITY): Payer: Self-pay | Admitting: Obstetrics and Gynecology

## 2024-01-26 DIAGNOSIS — O99281 Endocrine, nutritional and metabolic diseases complicating pregnancy, first trimester: Secondary | ICD-10-CM | POA: Diagnosis not present

## 2024-01-26 DIAGNOSIS — R109 Unspecified abdominal pain: Secondary | ICD-10-CM | POA: Diagnosis present

## 2024-01-26 DIAGNOSIS — Z3A12 12 weeks gestation of pregnancy: Secondary | ICD-10-CM | POA: Diagnosis not present

## 2024-01-26 DIAGNOSIS — E860 Dehydration: Secondary | ICD-10-CM | POA: Insufficient documentation

## 2024-01-26 DIAGNOSIS — Z3A08 8 weeks gestation of pregnancy: Secondary | ICD-10-CM | POA: Diagnosis not present

## 2024-01-26 DIAGNOSIS — Z5986 Financial insecurity: Secondary | ICD-10-CM | POA: Diagnosis not present

## 2024-01-26 DIAGNOSIS — O26891 Other specified pregnancy related conditions, first trimester: Secondary | ICD-10-CM | POA: Diagnosis present

## 2024-01-26 DIAGNOSIS — O210 Mild hyperemesis gravidarum: Secondary | ICD-10-CM | POA: Insufficient documentation

## 2024-01-26 MED ORDER — ONDANSETRON 4 MG PO TBDP
8.0000 mg | ORAL_TABLET | Freq: Once | ORAL | Status: AC
  Administered 2024-01-26: 8 mg via ORAL
  Filled 2024-01-26: qty 2

## 2024-01-26 NOTE — MAU Note (Signed)
 Beverly Paul is a 28 y.o. at Unknown here in MAU reporting: so far has had nausea but today nausea worsened and started vomiting - unable to keep anything down. Emesis x4. Reports HA and bubbling feeling in abdomen like I have to poop but is unable to have BM.   LMP: 10/31/2023 Pain score: 7 - HA; 9 - abdomen  Vitals:   01/26/24 2251  BP: 115/65  Pulse: 82  Resp: 18  Temp: 98.6 F (37 C)  SpO2: 100%     FHT: unable to hear with doppler.   Lab orders placed from triage: UA

## 2024-01-27 DIAGNOSIS — O210 Mild hyperemesis gravidarum: Secondary | ICD-10-CM

## 2024-01-27 DIAGNOSIS — E860 Dehydration: Secondary | ICD-10-CM

## 2024-01-27 DIAGNOSIS — Z3A08 8 weeks gestation of pregnancy: Secondary | ICD-10-CM

## 2024-01-27 MED ORDER — ONDANSETRON HCL 4 MG PO TABS: 4.0000 mg | ORAL_TABLET | Freq: Three times a day (TID) | ORAL | 1 refills | Status: AC | PRN

## 2024-01-27 MED ORDER — SENNA 8.6 MG PO TABS: 2.0000 | ORAL_TABLET | Freq: Every evening | ORAL | 1 refills | Status: AC | PRN

## 2024-01-27 NOTE — MAU Provider Note (Signed)
 Chief Complaint: Abdominal Pain, Headache, Emesis, and Nausea  SUBJECTIVE HPI: Beverly Paul is a 28 y.o. G1P0 at [redacted]w[redacted]d by LMP who presents to maternity admissions reporting nausea/vomiting/abdominal pain.  Patient has had some nausea for the past few weeks related to pregnancy.  Today she started to vomit and was unable to keep down liquids or solids.  She has a bubbling sensation in her lower abdomen and feels like she needs to have a bowel movement.  Has to strain to go.  She denies fever/chills, vaginal bleeding, loss of fluid, urinary symptoms.  She has not had a ultrasound yet this pregnancy.  [facility] this month to start prenatal care.  HPI  Past Medical History:  Diagnosis Date   Allergy    seasonal   Migraines    No past surgical history on file. Social History   Socioeconomic History   Marital status: Single    Spouse name: Not on file   Number of children: Not on file   Years of education: Not on file   Highest education level: Not on file  Occupational History   Not on file  Tobacco Use   Smoking status: Never   Smokeless tobacco: Never  Substance and Sexual Activity   Alcohol use: Not Currently   Drug use: Never   Sexual activity: Yes    Partners: Male    Birth control/protection: Pill  Other Topics Concern   Not on file  Social History Narrative   Not on file   Social Drivers of Health   Financial Resource Strain: Medium Risk (01/16/2024)   Received from [facility]   Overall Financial Resource Strain (CARDIA)    How hard is it for you to pay for the very basics like food, housing, medical care, and heating?: Somewhat hard  Food Insecurity: No Food Insecurity (01/16/2024)   Received from [facility]   Hunger Vital Sign    Within the past 12 months, you worried that your food would run out before you got the money to buy more.: Never true    Within the past 12 months, the food you bought just didn't last  and you didn't have money to get more.: Never true  Transportation Needs: No Transportation Needs (01/16/2024)   Received from [facility] - [facility]    Lack of Transportation (Medical): No    Lack of Transportation (Non-Medical): No  Physical Activity: Not on file  Stress: Not on file  Social Connections: Not on file  Intimate Partner Violence: Not on file   No current facility-administered medications on file prior to encounter.   No current outpatient medications on file prior to encounter.   No Known Allergies  ROS:  Pertinent positives/negatives listed above.  I have reviewed patient's Past Medical Hx, Surgical Hx, Family Hx, Social Hx, medications and allergies.   Physical Exam  Patient Vitals for the past 24 hrs:  BP Temp Temp src Pulse Resp SpO2 Height Weight  01/26/24 2251 115/65 98.6 F (37 C) Oral 82 18 100 % 5' 4 (1.626 m) 50.8 kg   Constitutional: Well-developed, well-nourished female in no acute distress.  Cardiovascular: normal rate Respiratory: normal effort GI: Abd soft, non-tender. Pos BS x 4 MS: Extremities nontender, no edema, normal ROM Neurologic: Alert and oriented x 4 GU: Neg CVAT  BSUS: Unable to obtain doppler heart tones. Ultrasound used to visualize IUP of approximately 8 weeks with heart beat present  LAB RESULTS No results found for this or any previous visit (from the past 24 hours).    IMAGING No results found.  MAU Management/MDM: Orders Placed This Encounter  Procedures   Urinalysis, Routine w reflex microscopic -Urine, Clean Catch   Discharge patient    Meds ordered this encounter  Medications   ondansetron  (ZOFRAN -ODT) disintegrating tablet 8 mg   ondansetron  (ZOFRAN ) 4 MG tablet    Sig: Take 1 tablet (4 mg total) by mouth every 8 (eight) hours as needed for nausea or vomiting.    Dispense:  90 tablet    Refill:  1   senna (SENOKOT) 8.6 MG TABS tablet    Sig: Take 2 tablets (17.2 mg total) by mouth at  bedtime as needed for mild constipation.    Dispense:  60 tablet    Refill:  1    Patient presents with nausea and vomiting that started this morning at approximately [redacted] weeks gestation pregnancy.  Confirmed IUP via bedside ultrasound.  Informed her that she is likely earlier than 12 weeks based on ultrasound but does still need a formal dating ultrasound.  She received Zofran  8 mg ODT with resolution in symptoms.  Was able to keep down Pedialyte and some crackers.  Denies any abdominal pain currently and has a benign abdomen.  Will send home with Zofran  and senna for symptomatic management.  ASSESSMENT 1. Morning sickness   2. Dehydration   3. [redacted] weeks gestation of pregnancy     PLAN Discharge home with strict return precautions. Allergies as of 01/27/2024   No Known Allergies      Medication List     STOP taking these medications    amitriptyline  10 MG tablet Commonly known as: ELAVIL    doxycycline  100 MG capsule Commonly known as: VIBRAMYCIN    fluconazole 150 MG tablet Commonly known as: DIFLUCAN   levonorgestrel-ethinyl estradiol 0.1-20 MG-MCG tablet Commonly known as: ALESSE   nitrofurantoin  (macrocrystal-monohydrate) 100 MG capsule Commonly known as: MACROBID    ondansetron  4 MG disintegrating tablet Commonly known as: ZOFRAN -ODT   pantoprazole  40 MG tablet Commonly known as: PROTONIX    phenazopyridine  200 MG tablet Commonly known as: PYRIDIUM        TAKE these medications    ondansetron  4 MG tablet Commonly known as: Zofran  Take 1 tablet (4 mg total) by mouth every 8 (eight) hours as needed for nausea or vomiting.   senna 8.6 MG Tabs tablet Commonly known as: SENOKOT Take 2 tablets (17.2 mg total) by mouth at bedtime as needed for mild constipation.         Almarie Moats, MD OB Fellow 01/27/2024  12:53 AM

## 2024-02-16 DIAGNOSIS — F1290 Cannabis use, unspecified, uncomplicated: Secondary | ICD-10-CM | POA: Insufficient documentation

## 2024-03-30 ENCOUNTER — Inpatient Hospital Stay (HOSPITAL_COMMUNITY)
Admission: AD | Admit: 2024-03-30 | Discharge: 2024-03-30 | Disposition: A | Discharge status: Home / Self Care | Attending: Obstetrics and Gynecology | Admitting: Obstetrics and Gynecology

## 2024-03-30 ENCOUNTER — Other Ambulatory Visit: Payer: Self-pay

## 2024-03-30 DIAGNOSIS — O98512 Other viral diseases complicating pregnancy, second trimester: Secondary | ICD-10-CM | POA: Diagnosis present

## 2024-03-30 DIAGNOSIS — Z3A21 21 weeks gestation of pregnancy: Secondary | ICD-10-CM | POA: Diagnosis not present

## 2024-03-30 DIAGNOSIS — J069 Acute upper respiratory infection, unspecified: Secondary | ICD-10-CM

## 2024-03-30 LAB — RESP PANEL BY RT-PCR (RSV, FLU A&B, COVID)  RVPGX2
Influenza A by PCR: NEGATIVE
Influenza B by PCR: NEGATIVE
Resp Syncytial Virus by PCR: NEGATIVE
SARS Coronavirus 2 by RT PCR: NEGATIVE

## 2024-03-30 LAB — GROUP A STREP BY PCR: Group A Strep by PCR: NOT DETECTED

## 2024-03-30 NOTE — MAU Provider Note (Signed)
 None     S [name] is a 28 y.o. G1P0 female at [redacted]w[redacted]d who presents to MAU today with complaint of headache, congestion, rhinorrhea, sore throat, and sneezing.  She reports that symptoms started on 03/27/2024.  She has been taking Robitussin since symptoms started.  She states that after she sneezed today she felt like she pulled something in her RLQ.  Denies vaginal bleeding or leaking of fluids.  Receives care at [facility]. Prenatal records reviewed.  Pertinent items noted in HPI and remainder of comprehensive ROS otherwise negative.   O BP 107/63 (BP Location: Right Arm)   Pulse 81   Temp 98.6 F (37 C) (Oral)   Resp 14   Ht 5' 4 (1.626 m)   Wt 52.7 kg   LMP 10/31/2023 (Approximate)   SpO2 98%   BMI 19.95 kg/m  Physical Exam Vitals reviewed.  Constitutional:      General: She is not in acute distress.    Appearance: She is well-developed. She is not diaphoretic.  Eyes:     General: No scleral icterus. Pulmonary:     Effort: Pulmonary effort is normal. No respiratory distress.  Skin:    General: Skin is warm and dry.  Neurological:     Mental Status: She is alert.     Coordination: Coordination normal.    Results for orders placed or performed during the hospital encounter of 03/30/24 (from the past 24 hours)  Resp panel by RT-PCR (RSV, Flu A&B, Covid) Anterior Nasal Swab     Status: None   Collection Time: 03/30/24  3:10 PM   Specimen: Anterior Nasal Swab  Result Value Ref Range   SARS Coronavirus 2 by RT PCR NEGATIVE NEGATIVE   Influenza A by PCR NEGATIVE NEGATIVE   Influenza B by PCR NEGATIVE NEGATIVE   Resp Syncytial Virus by PCR NEGATIVE NEGATIVE  Group A Strep by PCR     Status: None   Collection Time: 03/30/24  3:10 PM   Specimen: Throat; Sterile Swab  Result Value Ref Range   Group A Strep by PCR NOT DETECTED NOT DETECTED    MDM: Moderate MAU Course: -Vital signs within normal limits. -Swabs to rule out strep pharyngitis, COVID-19,  influenza, RSV. -All swabs negative.  A 1. Viral URI (Primary) - Discharge patient  2. [redacted] weeks gestation of pregnancy - Discharge patient   Medical screening exam complete  P Discharge from MAU in stable condition with preterm labor precautions Follow up at [facility] as scheduled for ongoing prenatal care Recommend supportive care with aggressive PO hydration and symptomatic management. List of medications that are safe in pregnancy provided in AVS.  No future appointments. Allergies as of 03/30/2024   No Known Allergies      Medication List     TAKE these medications    aspirin EC 81 MG tablet Take 81 mg by mouth daily.   ondansetron  4 MG tablet Commonly known as: Zofran  Take 1 tablet (4 mg total) by mouth every 8 (eight) hours as needed for nausea or vomiting.   senna 8.6 MG Tabs tablet Commonly known as: SENOKOT Take 2 tablets (17.2 mg total) by mouth at bedtime as needed for mild constipation.         Joesph DELENA Sear, PA

## 2024-03-30 NOTE — Discharge Instructions (Signed)
 Upper Respiratory Illness:    Aches/Pains, Fever, Headache OTC Acetaminophen  (Tylenol ) 500 mg tablets - take max 2 tablets (1000 mg) every 6 hours (4 times per day)    Sinus Congestion Prescription fluticasone (Flonase) as directed Nasal Saline if desired to rinse pseudoephedrine (Sudafed) -  use only after [redacted] weeks gestation and if you do not have high blood pressure  Oxymetolazone (Afrin, others) sparing use due to rebound congestion, NEVER use in kids Phenylephrine (Sudafed) 10 mg tablets every 4 hours (or the 12-hour formulation)* Diphenhydramine (Benadryl) 25 mg tablets - take max 2 tablets every 4 hours, can make you drowsy/sleepy cetirizine (Zyrtec) - similar to Benadryl but does not make you sleepy Vicks Vaporub - opens up nasal passages to make breathing easier Breath right strips   Cough & Sore Throat Alcohol-free cough drops Chloraseptic throat spray Cepacol throat lozenges Cold-Eeze - up to three times per day  Dextromethorphan  (Robitussin, others) - cough suppressant Guaifenesin  (Robitussin, Mucinex , others) - expectorant (helps cough up mucus) Robitussin DM (plain only, alcohol-free) - includes cough suppressant! (Dextromethorphan  and Guaifenesin  also come in a combination tablet/syrup) Lozenges w/ Benzocaine + Menthol (Cepacol) Honey - as much as you want! It has some antimicrobial properties and helps soothe the throat Teas which coat the throat - look for ingredients Elm Bark, Licorice Root, Marshmallow Root Any warm beverage or soup can be soothing for sore throat   Other Zinc Lozenges within 24 hours of symptoms onset - mixed evidence this shortens the duration of the common cold

## 2024-03-30 NOTE — MAU Note (Signed)
 Beverly Paul is a 28 y.o. at [redacted]w[redacted]d here in MAU reporting: she's having flu like symptoms that include HA, sneezing, congestion, runny nose, and sore throat.  States has been symptomatic since Saturday, has been taking Robitussin since onset of symptoms.  Also reports pain in RLQ after sneezing today.  States pain is feels as if she pulled something.  Denies VB or LOF.  Reports hasn't begun to feel FM.  LMP: 10/31/2023 Onset of complaint: Saturday Pain score: 0, for RLQ pain occurs when sneezing & 8 for HA Vitals:   03/30/24 1504  BP: 107/63  Pulse: 81  Resp: 14  Temp: 98.6 F (37 C)  SpO2: 98%     FHT: 150 bpm  Lab orders placed from triage: Resp Panel
